# Patient Record
Sex: Male | Born: 1965 | Race: Black or African American | Hispanic: No | Marital: Married | Smoking: Never smoker
Health system: Southern US, Community
[De-identification: ages and names within clinical notes are randomized; demographics above are authoritative.]

## PROBLEM LIST (undated history)

## (undated) DIAGNOSIS — K219 Gastro-esophageal reflux disease without esophagitis: Secondary | ICD-10-CM

## (undated) DIAGNOSIS — I10 Essential (primary) hypertension: Secondary | ICD-10-CM

## (undated) HISTORY — DX: Gastro-esophageal reflux disease without esophagitis: K21.9

## (undated) HISTORY — DX: Essential (primary) hypertension: I10

---

## 2017-09-12 ENCOUNTER — Other Ambulatory Visit: Payer: Self-pay | Admitting: Family Medicine

## 2017-09-12 DIAGNOSIS — I10 Essential (primary) hypertension: Secondary | ICD-10-CM

## 2017-09-12 DIAGNOSIS — N289 Disorder of kidney and ureter, unspecified: Secondary | ICD-10-CM

## 2017-09-14 ENCOUNTER — Other Ambulatory Visit: Payer: Self-pay

## 2017-09-26 ENCOUNTER — Ambulatory Visit
Admission: RE | Admit: 2017-09-26 | Discharge: 2017-09-26 | Disposition: A | Discharge status: Home / Self Care | Payer: BLUE CROSS/BLUE SHIELD | Source: Ambulatory Visit | Attending: Family Medicine | Admitting: Family Medicine

## 2017-09-26 DIAGNOSIS — N289 Disorder of kidney and ureter, unspecified: Secondary | ICD-10-CM

## 2017-09-26 DIAGNOSIS — I10 Essential (primary) hypertension: Secondary | ICD-10-CM

## 2017-12-11 ENCOUNTER — Ambulatory Visit: Payer: Self-pay | Admitting: Urology

## 2018-01-09 ENCOUNTER — Ambulatory Visit: Payer: BLUE CROSS/BLUE SHIELD | Admitting: Urology

## 2018-02-27 ENCOUNTER — Other Ambulatory Visit: Payer: Self-pay

## 2018-02-27 ENCOUNTER — Encounter: Payer: Self-pay | Admitting: Urology

## 2018-02-27 ENCOUNTER — Ambulatory Visit (INDEPENDENT_AMBULATORY_CARE_PROVIDER_SITE_OTHER): Payer: BLUE CROSS/BLUE SHIELD | Admitting: Urology

## 2018-02-27 VITALS — BP 161/103 | HR 83 | Ht 71.0 in | Wt 199.0 lb

## 2018-02-27 DIAGNOSIS — R351 Nocturia: Secondary | ICD-10-CM

## 2018-02-27 DIAGNOSIS — N401 Enlarged prostate with lower urinary tract symptoms: Secondary | ICD-10-CM | POA: Diagnosis not present

## 2018-02-27 DIAGNOSIS — N400 Enlarged prostate without lower urinary tract symptoms: Secondary | ICD-10-CM | POA: Diagnosis not present

## 2018-02-27 LAB — URINALYSIS, COMPLETE
Bilirubin, UA: NEGATIVE
Glucose, UA: NEGATIVE
Ketones, UA: NEGATIVE
Leukocytes, UA: NEGATIVE
Nitrite, UA: NEGATIVE
Protein, UA: NEGATIVE
RBC, UA: NEGATIVE
Specific Gravity, UA: 1.01 (ref 1.005–1.030)
Urobilinogen, Ur: 0.2 mg/dL (ref 0.2–1.0)
pH, UA: 7 (ref 5.0–7.5)

## 2018-02-27 LAB — BLADDER SCAN AMB NON-IMAGING

## 2018-02-27 MED ORDER — FINASTERIDE 5 MG PO TABS: 5.0000 mg | ORAL_TABLET | Freq: Every day | ORAL | 3 refills | Status: DC

## 2018-02-27 NOTE — Progress Notes (Signed)
02/27/2018 3:07 PM   Leonard Richards Jane 04/20/1965 [id number]  Referring provider: Jarrett SohoWharton, Courtney, PA-C [address] [place], [place] [address]  Chief Complaint  Patient presents with  . Establish Care    HPI: 52 year old male who presents today for further evaluation of prostamegaly along with other gu related issues.  He reports that he underwent renal ultrasound ordered by his primary care physician for elevated creatinine 1.4.  He had normal kidneys bilaterally but there was incidental prostamegaly noted with some mass-effect into the bladder.  Mild baseline urinary symptoms including nocturia x3 when he drinks a protein shake just before bedtime.  When he voids this, he does not have many issues.  He also had some difficulty at times starting his urinary stream and weak stream.  IPSS as below.  No UTIs, gross hematuria or dysuria.  PSA 1.58 in 10/2017.  No recent rectal exam.  No family history of prostate cancer.  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  He is been interested in possible testosterone type supplementation to help with this.  He had a discussion with his primary care about this and she recommended discussing this further with the urologist.  Denies issues.  Libido intact.   IPSS    Row Name 02/27/18 1400         International Prostate Symptom Score   How often have you had the sensation of not emptying your bladder?  Less than half the time     How often have you had to urinate less than every two hours?  Not at All     How often have you found you stopped and started again several times when you urinated?  Not at All     How often have you found it difficult to postpone urination?  Less than 1 in 5 times     How often have you had a weak urinary stream?  Not at All     How often have you had to strain to start urination?  Not at All     How many times did you typically get up at night to urinate?  3 Times     Total IPSS Score   6       Quality of Life due to urinary symptoms   If you were to spend the rest of your life with your urinary condition just the way it is now how would you feel about that?  Pleased        Score:  1-7 Mild 8-19 Moderate 20-35 Severe  PMH: Past Medical History:  Diagnosis Date  . GERD (gastroesophageal reflux disease)   . Hypertension     Surgical History: History reviewed. No pertinent surgical history.  Home Medications:  Allergies as of 02/27/2018   No Known Allergies     Medication List        Accurate as of 02/27/18  3:07 PM. Always use your most recent med list.          amLODipine 10 MG tablet Commonly known as:  NORVASC daily.   finasteride 5 MG tablet Commonly known as:  PROSCAR Take 1 tablet (5 mg total) by mouth daily.   lisinopril 40 MG tablet Commonly known as:  PRINIVIL,ZESTRIL daily.   metFORMIN 500 MG 24 hr tablet Commonly known as:  GLUCOPHAGE-XR daily.   TOPROL XL 25 MG 24 hr tablet Generic drug:  metoprolol succinate daily.       Allergies: No Known Allergies  Family History: Family History  Problem Relation Age of Onset  . Heart defect Mother   . Thyroid disease Mother   . Asthma Mother   . Hypertension Mother   . Alzheimer's disease Maternal Grandmother   . Heart disease Maternal Grandfather     Social History:  reports that he has never smoked. He has never used smokeless tobacco. He reports that he drinks alcohol. He reports that he does not use drugs.  ROS: UROLOGY Frequent Urination?: No Hard to postpone urination?: No Burning/pain with urination?: No Get up at night to urinate?: No Leakage of urine?: No Urine stream starts and stops?: Yes Trouble starting stream?: Yes Do you have to strain to urinate?: No Blood in urine?: No Urinary tract infection?: No Sexually transmitted disease?: No Injury to kidneys or bladder?: No Painful intercourse?: No Weak stream?: No Erection problems?: No Penile pain?:  No  Gastrointestinal Nausea?: No Vomiting?: No Indigestion/heartburn?: No Diarrhea?: No Constipation?: No  Constitutional Fever: No Night sweats?: No Weight loss?: No Fatigue?: No  Skin Skin rash/lesions?: No Itching?: No  Eyes Blurred vision?: No Double vision?: No  Ears/Nose/Throat Sore throat?: No Sinus problems?: No  Hematologic/Lymphatic Swollen glands?: No Easy bruising?: No  Cardiovascular Leg swelling?: No Chest pain?: No  Respiratory Cough?: No Shortness of breath?: No  Endocrine Excessive thirst?: No  Musculoskeletal Back pain?: No Joint pain?: No  Neurological Headaches?: No Dizziness?: No  Psychologic Depression?: No Anxiety?: No  Physical Exam: BP (!) 161/103   Pulse 83   Ht 5\' 11"  (1.803 m)   Wt 199 lb (90.3 kg)   BMI 27.75 kg/m   Constitutional:  Alert and oriented, No acute distress. HEENT: [place] AT, moist mucus membranes.  Trachea midline, no masses. Cardiovascular: No clubbing, cyanosis, or edema. Respiratory: Normal respiratory effort, no increased work of breathing. Rectal: Normal sphincter tone.  45 cc prostate, nontender, no nodules, rubbery lateral lobes. Skin: No rashes, bruises or suspicious lesions.  Male pattern baldness appreciated. Neurologic: Grossly intact, no focal deficits, moving all 4 extremities. Psychiatric: Normal mood and affect.  Laboratory Data: Cr 1.4 PSA as above  Urinalysis Urinalysis reviewed today, completely negative.  Pertinent Imaging: Results for orders placed during the hospital encounter of 09/26/17  US RENAL   Narrative CLINICAL DATA:  52 year old hypertensive male with decreased renal function. Initial encounter.  EXAM: RENAL / URINARY TRACT ULTRASOUND COMPLETE  COMPARISON:  None.  FINDINGS: Right Kidney:  Length: 9.8 cm. Echogenicity within normal limits. No mass or hydronephrosis visualized.  Left Kidney:  Length: 9.4 cm. Echogenicity within normal limits. No mass  or hydronephrosis visualized.  Bladder:  Appears normal for degree of bladder distention.  Slightly lobulated prostate gland measuring up to 4.4 cm causes mild impression upon the bladder base. Clinical and laboratory correlation recommended.  IMPRESSION: Kidneys unremarkable.  Slightly lobulated prostate gland measuring up to 4.4 cm causes mild impression upon the bladder base. Clinical and laboratory correlation recommended.   Electronically Signed   By: Lacy Duverney M.D.   On: 09/26/2017 18:32    PVR 38 cc  Renal ultrasound was personally reviewed today.  Assessment & Plan:    1. Enlarged prostate Incidental prostamegaly with mild associated urinary symptoms Given his concern with male pattern baldness, finasteride may be a good medication to treat both his urinary symptoms as well as hair loss which is a side effect of the medication Reviewed the risks and benefits of this medication in detail including the black box warning Plan for  PSA and recheck of urinary symptoms in 1 year, rectal exam every other year until 68 then annually - Urinalysis, Complete - BLADDER SCAN AMB NON-IMAGING - PSA; Future  2. BPH associated with nocturia Discussed behavioral modification for nighttime urinary symptoms   Return in about 1 year (around 02/28/2019) for PSA/ DRE/ IPSS.  Vanna Scotland, MD  [facility] [address], [address] [place], [place] [address] [phone]

## 2019-01-15 ENCOUNTER — Other Ambulatory Visit: Payer: Self-pay | Admitting: Urology

## 2019-03-05 ENCOUNTER — Ambulatory Visit: Payer: BLUE CROSS/BLUE SHIELD | Admitting: Urology

## 2019-04-15 ENCOUNTER — Other Ambulatory Visit: Payer: Self-pay | Admitting: Urology

## 2019-05-07 ENCOUNTER — Ambulatory Visit: Payer: BC Managed Care – PPO | Admitting: Urology

## 2019-05-27 ENCOUNTER — Other Ambulatory Visit: Payer: Self-pay | Admitting: *Deleted

## 2019-05-27 DIAGNOSIS — N401 Enlarged prostate with lower urinary tract symptoms: Secondary | ICD-10-CM

## 2019-05-27 DIAGNOSIS — R351 Nocturia: Secondary | ICD-10-CM

## 2019-05-28 ENCOUNTER — Other Ambulatory Visit: Payer: BC Managed Care – PPO

## 2019-05-28 ENCOUNTER — Other Ambulatory Visit: Payer: Self-pay

## 2019-05-28 DIAGNOSIS — N401 Enlarged prostate with lower urinary tract symptoms: Secondary | ICD-10-CM

## 2019-05-29 LAB — PSA: Prostate Specific Ag, Serum: 0.8 ng/mL (ref 0.0–4.0)

## 2019-06-03 NOTE — Progress Notes (Signed)
06/04/2019 7:02 PM   Leonard Richards [birth year] [id number]  Referring provider: Marda Stalker, [place],  [place] [address]  Chief Complaint  Patient presents with  . Benign Prostatic Hypertrophy  . Nocturia    HPI: Leonard Richards is a 54 yo African American M who returns today for the evaluation and management of BPH with LUTS.   He was last seen 1 year ago for follow up.  He is currently on finasteride. Pt reports of no urinary symptoms and felt finasteride has helped him immensely. He reports of better stream and nocturia from x3 to x1.   His most recent PSA from 05/28/19 was 0.8.   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  No FMHx of prostate cancer.   IPSS    Row Name 06/04/19 1100         International Prostate Symptom Score   How often have you had the sensation of not emptying your bladder?  Not at All     How often have you had to urinate less than every two hours?  Not at All     How often have you found you stopped and started again several times when you urinated?  Not at All     How often have you found it difficult to postpone urination?  Not at All     How often have you had a weak urinary stream?  Not at All     How often have you had to strain to start urination?  Not at All     How many times did you typically get up at night to urinate?  1 Time     Total IPSS Score  1       Quality of Life due to urinary symptoms   If you were to spend the rest of your life with your urinary condition just the way it is now how would you feel about that?  Pleased         Score:  1-7 Mild 8-19 Moderate 20-35 Severe  PMH: Past Medical History:  Diagnosis Date  . GERD (gastroesophageal reflux disease)   . Hypertension     Surgical History: No past surgical history on file.  Home Medications:  Allergies as of 06/04/2019   No Known Allergies     Medication List       Accurate as of June 04, 2019  7:02  PM. If you have any questions, ask your nurse or doctor.        amLODipine 10 MG tablet Commonly known as: NORVASC daily.   finasteride 5 MG tablet Commonly known as: PROSCAR TAKE 1 TABLET DAILY   lisinopril 40 MG tablet Commonly known as: ZESTRIL daily.   metFORMIN 500 MG 24 hr tablet Commonly known as: GLUCOPHAGE-XR daily.   Toprol XL 25 MG 24 hr tablet Generic drug: metoprolol succinate daily.       Allergies: No Known Allergies  Family History: Family History  Problem Relation Age of Onset  . Heart defect Mother   . Thyroid disease Mother   . Asthma Mother   . Hypertension Mother   . Alzheimer's disease Maternal Grandmother   . Heart disease Maternal Grandfather     Social History:  reports that he has never smoked. He has never used smokeless tobacco. He reports current alcohol use. He reports that he does not use drugs.   Physical Exam: BP 133/83   Pulse 91   Ht  5\' 11"  (1.803 m)   Wt 189 lb (85.7 kg)   BMI 26.36 kg/m   Constitutional:  Alert and oriented, No acute distress. HEENT: [place] AT, moist mucus membranes.  Trachea midline, no masses. Cardiovascular: No clubbing, cyanosis, or edema. Respiratory: Normal respiratory effort, no increased work of breathing. Rectal: Prostate 40g, non tender, no nodules and normal sphincter tone  Skin: No rashes, bruises or suspicious lesions. Neurologic: Grossly intact, no focal deficits moving all 4 extremities. Psychiatric: Normal mood and affect.  Assessment & Plan:    1. 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 PSA screening  Up to date; no concerns  Return in about 1 year (around 06/03/2020) for IPSS, PSA/ DRE.  [facility] [address], [address] [place], [place] [phone]  I, [phone], am acting as a scribe for Dr. Donne Hazel,  I have seen and examined the patient, labs/ imaging reviewed and discussed  management with Vanna Scotland.  I reviewed the PA's note and agree with the documented findings and plan of care.

## 2019-06-04 ENCOUNTER — Other Ambulatory Visit: Payer: Self-pay

## 2019-06-04 ENCOUNTER — Ambulatory Visit (INDEPENDENT_AMBULATORY_CARE_PROVIDER_SITE_OTHER): Payer: BC Managed Care – PPO | Admitting: Urology

## 2019-06-04 VITALS — BP 133/83 | HR 91 | Ht 71.0 in | Wt 189.0 lb

## 2019-06-04 DIAGNOSIS — Z125 Encounter for screening for malignant neoplasm of prostate: Secondary | ICD-10-CM | POA: Diagnosis not present

## 2019-06-04 DIAGNOSIS — R351 Nocturia: Secondary | ICD-10-CM | POA: Diagnosis not present

## 2019-06-04 DIAGNOSIS — N401 Enlarged prostate with lower urinary tract symptoms: Secondary | ICD-10-CM

## 2019-06-05 MED ORDER — FINASTERIDE 5 MG PO TABS: 5.0000 mg | ORAL_TABLET | Freq: Every day | ORAL | 0 refills | Status: AC

## 2019-06-05 NOTE — Addendum Note (Signed)
Addended by: Milas Kocher A on: 06/05/2019 11:54 AM   Modules accepted: Orders

## 2020-04-08 ENCOUNTER — Other Ambulatory Visit: Payer: Self-pay

## 2020-04-10 ENCOUNTER — Other Ambulatory Visit: Payer: Self-pay

## 2020-04-10 ENCOUNTER — Other Ambulatory Visit: Payer: BC Managed Care – PPO

## 2020-04-10 DIAGNOSIS — R351 Nocturia: Secondary | ICD-10-CM

## 2020-04-10 DIAGNOSIS — Z125 Encounter for screening for malignant neoplasm of prostate: Secondary | ICD-10-CM

## 2020-04-10 DIAGNOSIS — N401 Enlarged prostate with lower urinary tract symptoms: Secondary | ICD-10-CM

## 2020-04-11 LAB — PSA: Prostate Specific Ag, Serum: 1.2 ng/mL (ref 0.0–4.0)

## 2020-05-20 ENCOUNTER — Ambulatory Visit: Payer: Self-pay | Admitting: Urology

## 2020-05-29 ENCOUNTER — Other Ambulatory Visit: Payer: Self-pay

## 2020-06-02 ENCOUNTER — Ambulatory Visit: Payer: Self-pay | Admitting: Physician Assistant

## 2020-06-09 ENCOUNTER — Ambulatory Visit: Payer: Self-pay | Admitting: Urology

## 2020-07-14 ENCOUNTER — Ambulatory Visit: Payer: BC Managed Care – PPO | Admitting: Urology

## 2020-07-21 NOTE — Progress Notes (Signed)
Patient left clinic abruptly prior to being seen.  He will reschedule.  Vanna Scotland, MD

## 2020-07-22 ENCOUNTER — Emergency Department
Admission: EM | Admit: 2020-07-22 | Discharge: 2020-07-22 | Disposition: A | Discharge status: Home / Self Care | Payer: BC Managed Care – PPO | Attending: Emergency Medicine | Admitting: Emergency Medicine

## 2020-07-22 ENCOUNTER — Other Ambulatory Visit: Payer: Self-pay

## 2020-07-22 ENCOUNTER — Ambulatory Visit (INDEPENDENT_AMBULATORY_CARE_PROVIDER_SITE_OTHER): Payer: BC Managed Care – PPO | Admitting: Urology

## 2020-07-22 ENCOUNTER — Emergency Department: Payer: BC Managed Care – PPO

## 2020-07-22 ENCOUNTER — Encounter: Payer: Self-pay | Admitting: Emergency Medicine

## 2020-07-22 DIAGNOSIS — Z79899 Other long term (current) drug therapy: Secondary | ICD-10-CM | POA: Diagnosis not present

## 2020-07-22 DIAGNOSIS — R55 Syncope and collapse: Secondary | ICD-10-CM | POA: Diagnosis not present

## 2020-07-22 DIAGNOSIS — E119 Type 2 diabetes mellitus without complications: Secondary | ICD-10-CM | POA: Diagnosis not present

## 2020-07-22 DIAGNOSIS — I10 Essential (primary) hypertension: Secondary | ICD-10-CM | POA: Diagnosis not present

## 2020-07-22 DIAGNOSIS — R1032 Left lower quadrant pain: Secondary | ICD-10-CM | POA: Diagnosis present

## 2020-07-22 DIAGNOSIS — K4090 Unilateral inguinal hernia, without obstruction or gangrene, not specified as recurrent: Secondary | ICD-10-CM | POA: Diagnosis not present

## 2020-07-22 DIAGNOSIS — Z7984 Long term (current) use of oral hypoglycemic drugs: Secondary | ICD-10-CM | POA: Insufficient documentation

## 2020-07-22 DIAGNOSIS — N401 Enlarged prostate with lower urinary tract symptoms: Secondary | ICD-10-CM

## 2020-07-22 DIAGNOSIS — R351 Nocturia: Secondary | ICD-10-CM

## 2020-07-22 LAB — COMPREHENSIVE METABOLIC PANEL
ALT: 37 U/L (ref 0–44)
AST: 38 U/L (ref 15–41)
Albumin: 4 g/dL (ref 3.5–5.0)
Alkaline Phosphatase: 59 U/L (ref 38–126)
Anion gap: 9 (ref 5–15)
BUN: 20 mg/dL (ref 6–20)
CO2: 24 mmol/L (ref 22–32)
Calcium: 9 mg/dL (ref 8.9–10.3)
Chloride: 105 mmol/L (ref 98–111)
Creatinine, Ser: 1.28 mg/dL — ABNORMAL HIGH (ref 0.61–1.24)
GFR, Estimated: >60 mL/min (ref >60)
Glucose, Bld: 111 mg/dL — ABNORMAL HIGH (ref 70–99)
Potassium: 3.8 mmol/L (ref 3.5–5.1)
Sodium: 138 mmol/L (ref 135–145)
Total Bilirubin: 0.7 mg/dL (ref 0.3–1.2)
Total Protein: 7.6 g/dL (ref 6.5–8.1)

## 2020-07-22 LAB — CBC
HCT: 44.5 % (ref 39.0–52.0)
Hemoglobin: 14.4 g/dL (ref 13.0–17.0)
MCH: 26.6 pg (ref 26.0–34.0)
MCHC: 32.4 g/dL (ref 30.0–36.0)
MCV: 82.1 fL (ref 80.0–100.0)
Platelets: 265 10*3/uL (ref 150–400)
RBC: 5.42 MIL/uL (ref 4.22–5.81)
RDW: 13.9 % (ref 11.5–15.5)
WBC: 4.3 10*3/uL (ref 4.0–10.5)
nRBC: 0 % (ref 0.0–0.2)

## 2020-07-22 LAB — URINALYSIS, COMPLETE (UACMP) WITH MICROSCOPIC
Bacteria, UA: NONE SEEN
Bilirubin Urine: NEGATIVE
Glucose, UA: NEGATIVE mg/dL
Hgb urine dipstick: NEGATIVE
Ketones, ur: NEGATIVE mg/dL
Leukocytes,Ua: NEGATIVE
Nitrite: NEGATIVE
Protein, ur: NEGATIVE mg/dL
Specific Gravity, Urine: 1.021 (ref 1.005–1.030)
Squamous Epithelial / HPF: NONE SEEN (ref 0–5)
pH: 7 (ref 5.0–8.0)

## 2020-07-22 LAB — TROPONIN I (HIGH SENSITIVITY): Troponin I (High Sensitivity): 6 ng/L (ref <18)

## 2020-07-22 LAB — LIPASE, BLOOD: Lipase: 123 U/L — ABNORMAL HIGH (ref 11–51)

## 2020-07-22 MED ORDER — LACTATED RINGERS IV BOLUS
1000.0000 mL | Freq: Once | INTRAVENOUS | Status: AC
  Administered 2020-07-22: 1000 mL via INTRAVENOUS

## 2020-07-22 MED ORDER — IOHEXOL 300 MG/ML  SOLN
100.0000 mL | Freq: Once | INTRAMUSCULAR | Status: AC | PRN
  Administered 2020-07-22: 100 mL via INTRAVENOUS

## 2020-07-22 MED ORDER — ONDANSETRON HCL 4 MG/2ML IJ SOLN
4.0000 mg | Freq: Once | INTRAMUSCULAR | Status: AC
  Administered 2020-07-22: 4 mg via INTRAVENOUS
  Filled 2020-07-22: qty 2

## 2020-07-22 MED ORDER — ONDANSETRON 4 MG PO TBDP: 4.0000 mg | ORAL_TABLET | Freq: Three times a day (TID) | ORAL | 0 refills | Status: AC | PRN

## 2020-07-22 NOTE — ED Provider Notes (Signed)
[facility] Emergency Department Provider Note   ____________________________________________   Event Date/Time   First MD Initiated Contact with Patient 07/22/20 1501     (approximate)  I have reviewed the triage vital signs and the nursing notes.   HISTORY  Chief Complaint Abdominal Pain    HPI Leonard Richards is a 55 y.o. male with past medical history of hypertension, diabetes, and GERD who presents to the ED complaining of abdominal pain.  Patient reports that he has been dealing with intermittent episodes of pain in the left lower quadrant of his abdomen for the past couple of weeks.  This is been associated with frequent loose bowel movements, but he denies any blood in his stool and has not had any nausea or vomiting.  He was going to his PCPs office to be seen for the symptoms today, when he had sudden onset of similar pain in his left lower quadrant but more severe than in the past.  When this pain came on, he started to feel very lightheaded like he was going to pass out.  He denies any associated chest pain or shortness of breath.  Pain is now improving and he has not taken anything for pain today.  He denies any fevers, dysuria, flank pain.        Past Medical History:  Diagnosis Date  . GERD (gastroesophageal reflux disease)   . Hypertension     There are no problems to display for this patient.   History reviewed. No pertinent surgical history.  Prior to Admission medications   Medication Sig Start Date End Date Taking? Authorizing Provider  amLODipine (NORVASC) 10 MG tablet daily. 12/15/17   [provider]  finasteride (PROSCAR) 5 MG tablet Take 1 tablet (5 mg total) by mouth daily. 06/05/19   Vanna Scotland, MD  lisinopril (PRINIVIL,ZESTRIL) 40 MG tablet daily. 02/17/18   [provider]  metFORMIN (GLUCOPHAGE-XR) 500 MG 24 hr tablet daily. 02/12/18   [provider]  TOPROL XL 25 MG 24 hr tablet daily.  02/09/18   [provider]  valsartan (DIOVAN) 320 MG tablet Take 320 mg by mouth daily. 06/19/20   [provider]    Allergies Patient has no known allergies.  Family History  Problem Relation Age of Onset  . Heart defect Mother   . Thyroid disease Mother   . Asthma Mother   . Hypertension Mother   . Alzheimer's disease Maternal Grandmother   . Heart disease Maternal Grandfather     Social History Social History   Tobacco Use  . Smoking status: Never Smoker  . Smokeless tobacco: Never Used  Vaping Use  . Vaping Use: Never used  Substance Use Topics  . Alcohol use: Yes    Comment: occasionally  . Drug use: Never    Review of Systems  Constitutional: No fever/chills Eyes: No visual changes. ENT: No sore throat. Cardiovascular: Denies chest pain.  Positive for lightheadedness and near syncope. Respiratory: Denies shortness of breath. Gastrointestinal: Positive for abdominal pain.  No nausea, no vomiting.  No diarrhea.  No constipation. Genitourinary: Negative for dysuria. Musculoskeletal: Negative for back pain. Skin: Negative for rash. Neurological: Negative for headaches, focal weakness or numbness.  ____________________________________________   PHYSICAL EXAM:  VITAL SIGNS: ED Triage Vitals  Enc Vitals Group     BP      Pulse      Resp      Temp      Temp src  SpO2      Weight      Height      Head Circumference      Peak Flow      Pain Score      Pain Loc      Pain Edu?      Excl. in GC?     Constitutional: Alert and oriented. Eyes: Conjunctivae are normal. Head: Atraumatic. Nose: No congestion/rhinnorhea. Mouth/Throat: Mucous membranes are moist. Neck: Normal ROM Cardiovascular: Normal rate, regular rhythm. Grossly normal heart sounds. Respiratory: Normal respiratory effort.  No retractions. Lungs CTAB. Gastrointestinal: Soft and mildly tender to palpation in the left lower quadrant with no rebound or guarding. No  distention. Genitourinary: deferred Musculoskeletal: No lower extremity tenderness nor edema. Neurologic:  Normal speech and language. No gross focal neurologic deficits are appreciated. Skin:  Skin is warm, dry and intact. No rash noted. Psychiatric: Mood and affect are normal. Speech and behavior are normal.  ____________________________________________   LABS (all labs ordered are listed, but only abnormal results are displayed)  Labs Reviewed  LIPASE, BLOOD - Abnormal; Notable for the following components:      Result Value   Lipase 123 (*)    All other components within normal limits  COMPREHENSIVE METABOLIC PANEL - Abnormal; Notable for the following components:   Glucose, Bld 111 (*)    Creatinine, Ser 1.28 (*)    All other components within normal limits  URINALYSIS, COMPLETE (UACMP) WITH MICROSCOPIC - Abnormal; Notable for the following components:   Color, Urine YELLOW (*)    APPearance CLEAR (*)    All other components within normal limits  CBC  TROPONIN I (HIGH SENSITIVITY)   ____________________________________________  EKG  ED ECG REPORT I, Chesley Noon, the attending physician, personally viewed and interpreted this ECG.   Date: 07/22/2020  EKG Time: 15:05  Rate: 76  Rhythm: normal sinus rhythm  Axis: Normal  Intervals:none  ST&T Change: None  PROCEDURES  Procedure(s) performed (including Critical Care):  Procedures   ____________________________________________   INITIAL IMPRESSION / ASSESSMENT AND PLAN / ED COURSE       55 year old male with past medical history of hypertension, diabetes, and GERD presents to the ED with intermittent left lower quadrant pain for the past couple of weeks, more severe today and associated with near syncope.  EKG shows no evidence of arrhythmia or ischemia and I doubt cardiac etiology of his lightheadedness and near syncope.  He denies any chest pain or shortness of breath, episode sounds more likely to be  vasovagal.  We will screen abdominal labs and CT scan given his tenderness, differential includes diverticulitis, inguinal hernia, IBS, IBD, gastroenteritis.  Labs are unremarkable, UA shows no signs of infection.  CT scan does show left inguinal hernia containing nonobstructed sigmoid colon.  On reexamination hernia is easily reducible and patient has minimal pain.  He is appropriate for discharge home with general surgery follow-up, was counseled to return to the ED for new or worsening symptoms.  Patient agrees with plan.      ____________________________________________   FINAL CLINICAL IMPRESSION(S) / ED DIAGNOSES  Final diagnoses:  Non-recurrent unilateral inguinal hernia without obstruction or gangrene     ED Discharge Orders    None       Note:  This document was prepared using Dragon voice recognition software and may include unintentional dictation errors.   Chesley Noon, MD 07/22/20 [phone]

## 2020-07-22 NOTE — ED Notes (Signed)
Patient transported to CT 

## 2020-07-22 NOTE — Progress Notes (Incomplete)
07/22/2020  2:35 PM    Leonard Richards 01/03/1966 [id number]  Referring provider: Jarrett Soho, PA-C [address] [place],  [place] [address] Chief Complaint  Patient presents with  . Benign Prostatic Hypertrophy    HPI: Leonard Richards is a 55 y.o. male with a history of BPH with associated nocturia and PSA screening, who presents today for a one year follow up for the evaluation and management of IPSS, DRE, and PSA.  Last seen on 06/04/2019. He was on Finasteride, and reported no urinary symptoms since using this. He reported better stream and nocturia decreased from x3 to x1.  No family history of prostate cancer.  Never smoker.  PSA trend: 0.8 - 05/28/2019 1.2 - 04/10/2020  Today ***    Score:  1-7 Mild 8-19 Moderate 20-35 Severe   PMH: Past Medical History:  Diagnosis Date  . GERD (gastroesophageal reflux disease)   . Hypertension     Surgical History: No past surgical history on file.  Home Medications:  Allergies as of 07/22/2020   No Known Allergies     Medication List       Accurate as of July 22, 2020  2:35 PM. If you have any questions, ask your nurse or doctor.        amLODipine 10 MG tablet Commonly known as: NORVASC daily.   finasteride 5 MG tablet Commonly known as: PROSCAR Take 1 tablet (5 mg total) by mouth daily.   lisinopril 40 MG tablet Commonly known as: ZESTRIL daily.   metFORMIN 500 MG 24 hr tablet Commonly known as: GLUCOPHAGE-XR daily.   Toprol XL 25 MG 24 hr tablet Generic drug: metoprolol succinate daily.   valsartan 320 MG tablet Commonly known as: DIOVAN Take 320 mg by mouth daily.       Allergies: No Known Allergies  Family History: Family History  Problem Relation Age of Onset  . Heart defect Mother   . Thyroid disease Mother   . Asthma Mother   . Hypertension Mother   . Alzheimer's disease Maternal Grandmother   . Heart disease Maternal Grandfather     Social History:   reports that he  has never smoked. He has never used smokeless tobacco. He reports current alcohol use. He reports that he does not use drugs.  ROS: Pertinent ROS in HPI.  Physical Exam: There were no vitals taken for this visit.  Constitutional:  Alert and oriented, No acute distress. HEENT: [place] AT, moist mucus membranes.  Trachea midline, no masses. Cardiovascular: No clubbing, cyanosis, or edema. Respiratory: Normal respiratory effort, no increased work of breathing. ***GI: Abdomen is soft, non tender, non distended, no abdominal masses. Liver and spleen not palpable.  No hernias appreciated.  Stool sample for occult testing is not indicated. ***GU: Phallus circumcised/uncircumcised without lesions, testes descended bilaterally without masses or tenderness, spermatic cord/epididymis palpably normal bilaterally.  Vasa palpable bilaterally ***Rectal: Prostate is *** grams, no nodules, non-tender, with normal sphincter tone. Skin: No rashes, bruises or suspicious lesions. Neurologic: Grossly intact, no focal deficits, moving all 4 extremities. Psychiatric: Normal mood and affect.  Laboratory Data:  No results found for: CREATININE    No results found for: PSA   No results found for: TESTOSTERONE   No results found for: HGBA1C   I have reviewed the labs.  Urinalysis    I have reviewed the labs.  Urine Culture:   I have reviewed the labs.  Pertinent Imaging:   No results found for this or any previous  visit.    I have personally reviewed the images and agree with radiologist interpretation.    Assessment & Plan:      Follow Up:  No follow-ups on file.   Jonette Pesa, am acting as a scribe for Dr. Vanna Scotland.    [facility] [address], [address] [place], [place] [address] [phone]

## 2020-07-22 NOTE — ED Triage Notes (Signed)
Pt comes into the ED via POV c/o LLQ abdominal pain that was sudden onset today where it caused him to become diaphoretic and have a near syncopal episode.  Pt denies any nausea, vomiting, or diarrhea.  Pt has even and unlabored respirations.  Pt states he has been having stomach issues for a little bit, but today was the first time he has had a sudden onset of pain and feeling as though he was going to pass out.

## 2020-07-22 NOTE — ED Notes (Signed)
RN to bedside to answer call light. Pt reports vomiting and diarrhea after drinking soda and eating chips, requesting meds.  Dr Larinda Buttery notified

## 2020-08-12 ENCOUNTER — Ambulatory Visit: Payer: BC Managed Care – PPO | Admitting: Urology

## 2020-08-19 ENCOUNTER — Ambulatory Visit: Payer: BC Managed Care – PPO | Admitting: Urology

## 2020-09-02 ENCOUNTER — Ambulatory Visit: Payer: BC Managed Care – PPO | Admitting: Urology

## 2020-09-16 ENCOUNTER — Ambulatory Visit: Payer: BC Managed Care – PPO | Admitting: Urology

## 2020-10-06 ENCOUNTER — Ambulatory Visit: Payer: BC Managed Care – PPO | Admitting: Urology

## 2020-10-21 ENCOUNTER — Ambulatory Visit: Payer: BC Managed Care – PPO | Admitting: Urology

## 2020-10-23 ENCOUNTER — Encounter: Payer: Self-pay | Admitting: Urology

## 2021-04-15 DIAGNOSIS — H524 Presbyopia: Secondary | ICD-10-CM | POA: Diagnosis not present

## 2021-04-15 DIAGNOSIS — E119 Type 2 diabetes mellitus without complications: Secondary | ICD-10-CM | POA: Diagnosis not present

## 2021-04-29 DIAGNOSIS — I10 Essential (primary) hypertension: Secondary | ICD-10-CM | POA: Diagnosis not present

## 2021-04-29 DIAGNOSIS — R350 Frequency of micturition: Secondary | ICD-10-CM | POA: Diagnosis not present

## 2021-04-29 DIAGNOSIS — N1831 Chronic kidney disease, stage 3a: Secondary | ICD-10-CM | POA: Diagnosis not present

## 2021-04-29 DIAGNOSIS — R739 Hyperglycemia, unspecified: Secondary | ICD-10-CM | POA: Diagnosis not present

## 2022-02-15 DIAGNOSIS — I10 Essential (primary) hypertension: Secondary | ICD-10-CM | POA: Diagnosis not present

## 2022-02-15 DIAGNOSIS — Z23 Encounter for immunization: Secondary | ICD-10-CM | POA: Diagnosis not present

## 2022-02-15 DIAGNOSIS — N1831 Chronic kidney disease, stage 3a: Secondary | ICD-10-CM | POA: Diagnosis not present

## 2022-02-15 DIAGNOSIS — R739 Hyperglycemia, unspecified: Secondary | ICD-10-CM | POA: Diagnosis not present

## 2022-02-15 DIAGNOSIS — Z125 Encounter for screening for malignant neoplasm of prostate: Secondary | ICD-10-CM | POA: Diagnosis not present

## 2022-07-29 IMAGING — CT CT ABD-PELV W/ CM
2 of 5 series · 16 of 46 positions shown, 18 images · IV contrast (omnipaque)
Comparison: None.

CLINICAL DATA: Left lower quadrant pain

EXAM:
CT ABDOMEN AND PELVIS WITH CONTRAST
TECHNIQUE: Multidetector CT imaging of the abdomen and pelvis was performed
using the standard protocol following bolus administration of
intravenous contrast.
CONTRAST:  100mL OMNIPAQUE IOHEXOL 300 MG/ML  SOLN

[Series 2: axial st · axial · 0.77mm/px · z∈[-506,-41]mm · 13 of 105 slices shown, 15 images]
[im 6/105  soft-tissue]
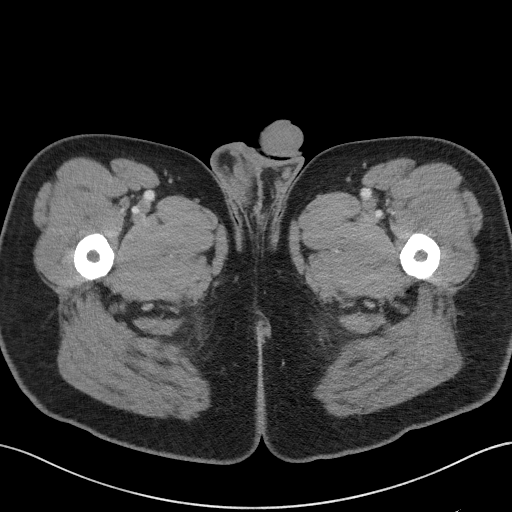
[im 6/105  bone]
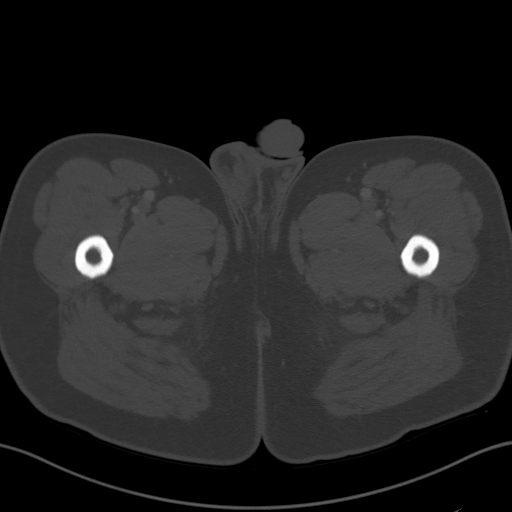
[im 17/105  soft-tissue]
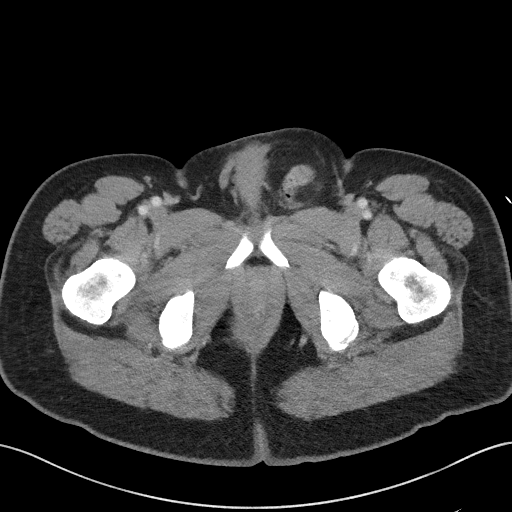
[im 22/105  soft-tissue]
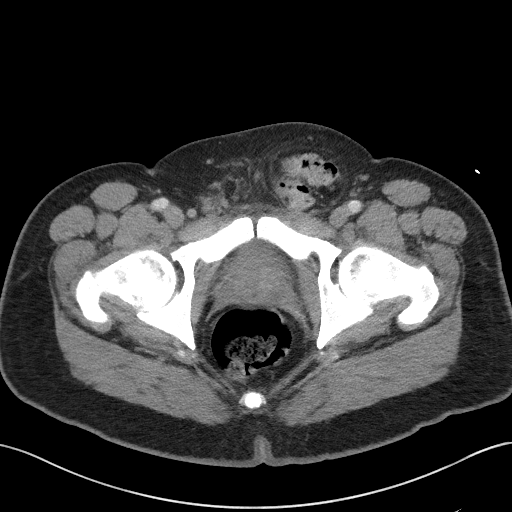
[im 28/105  soft-tissue]
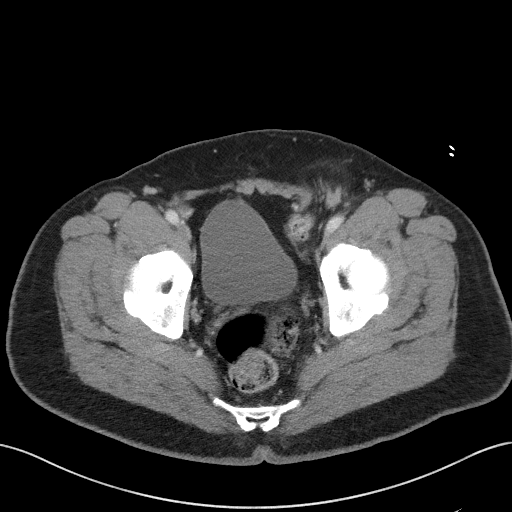
[im 39/105  soft-tissue]
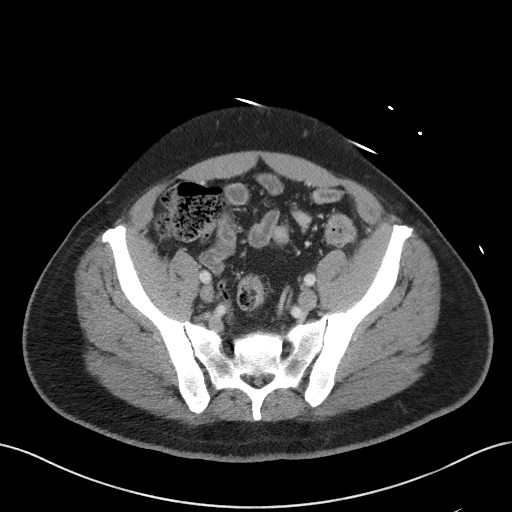
[im 44/105  soft-tissue]
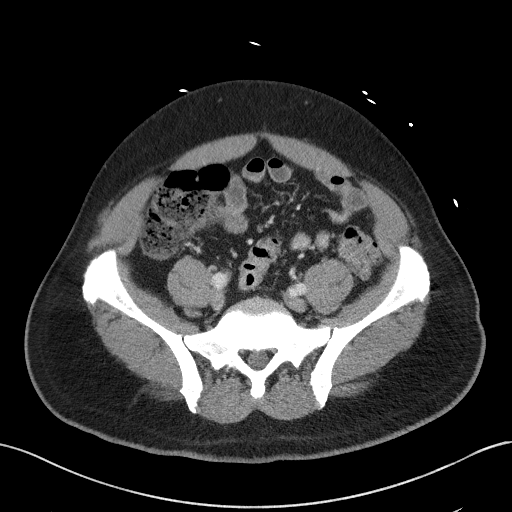
[im 55/105  soft-tissue]
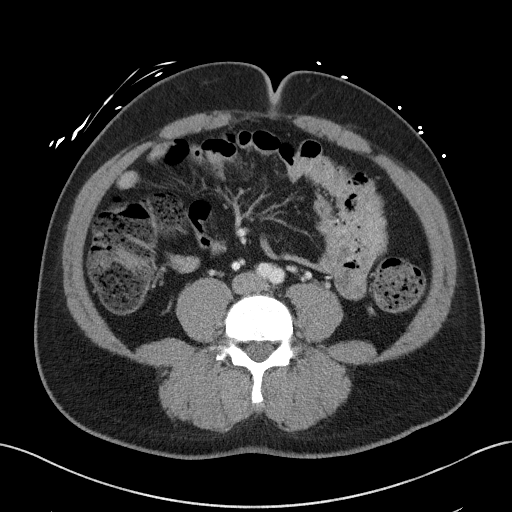
[im 61/105  soft-tissue]
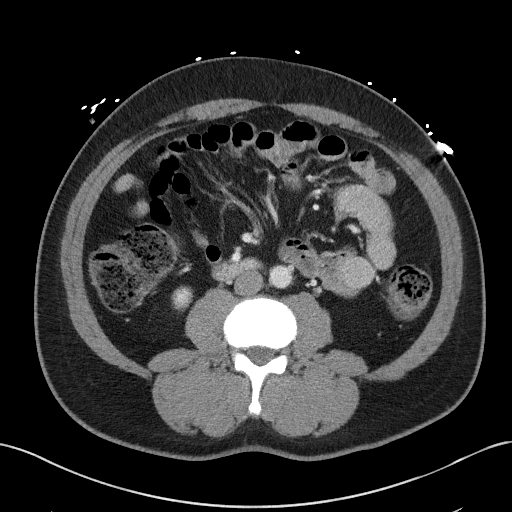
[im 66/105  soft-tissue]
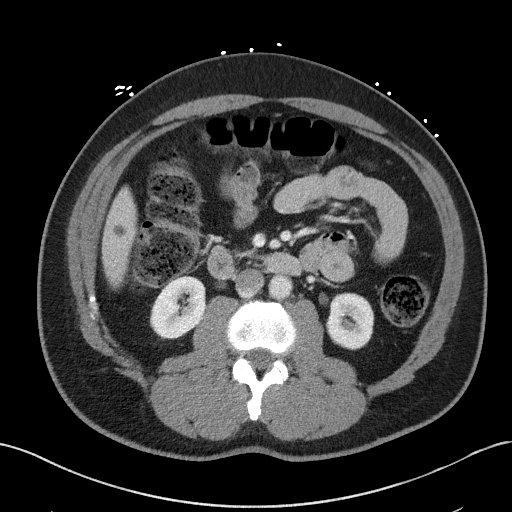
[im 66/105  bone]
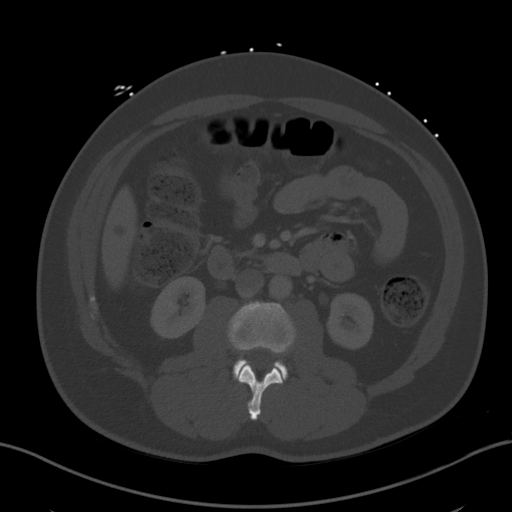
[im 77/105  soft-tissue]
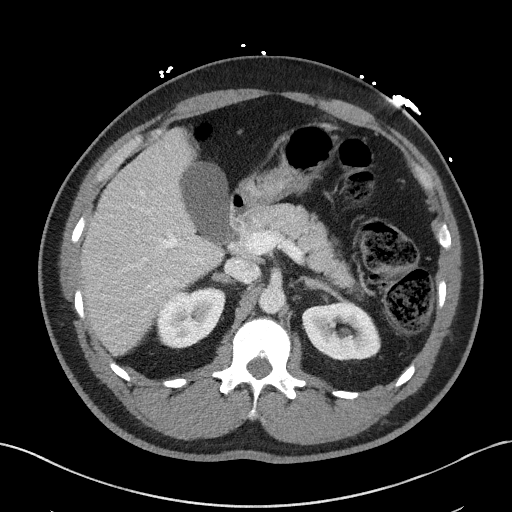
[im 83/105  soft-tissue]
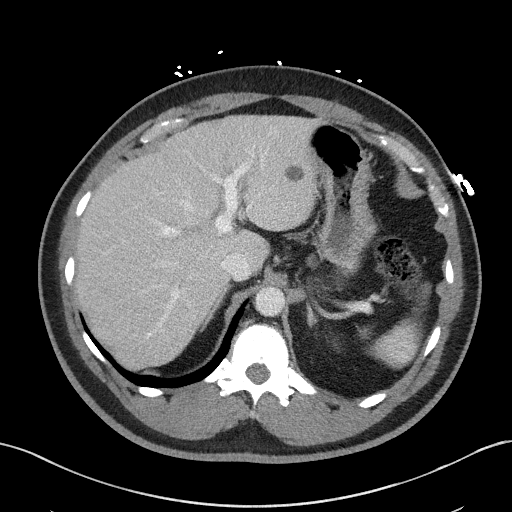
[im 88/105  soft-tissue]
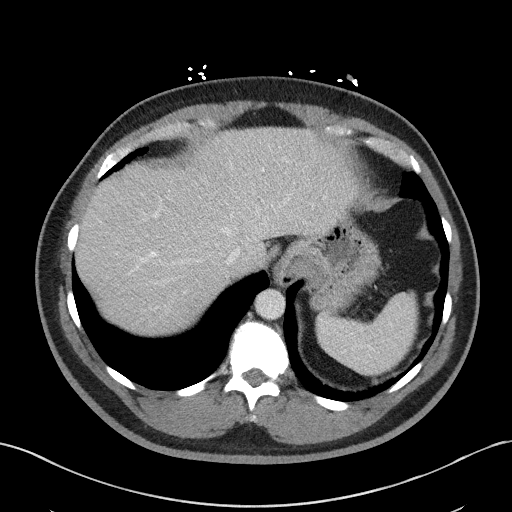
[im 99/105  soft-tissue]
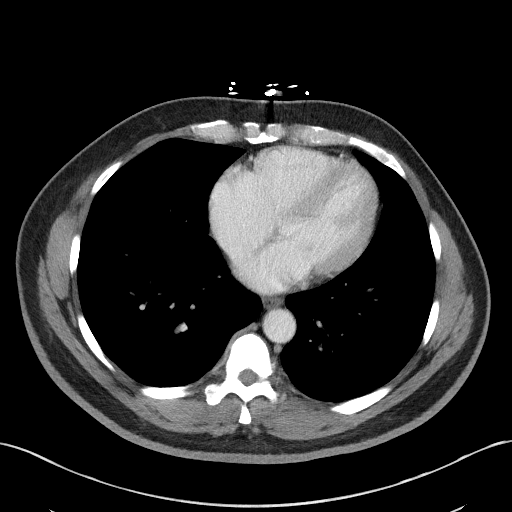

[Series 5: coronal st · coronal · 0.85mm/px · 3 of 101 slices shown]
[im 34/101  soft-tissue]
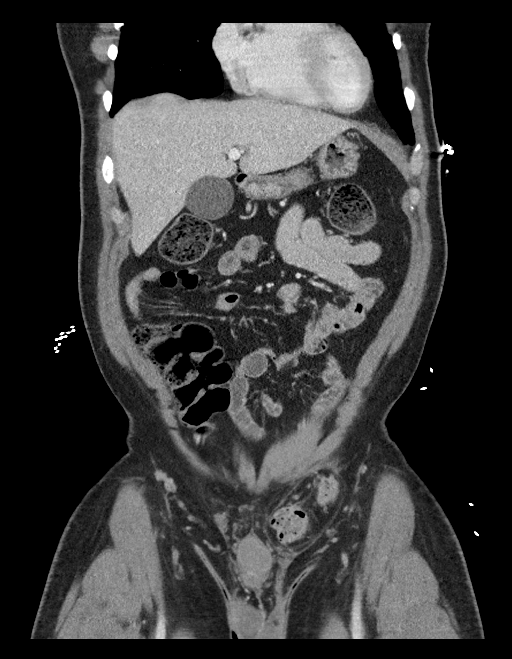
[im 45/101  soft-tissue]
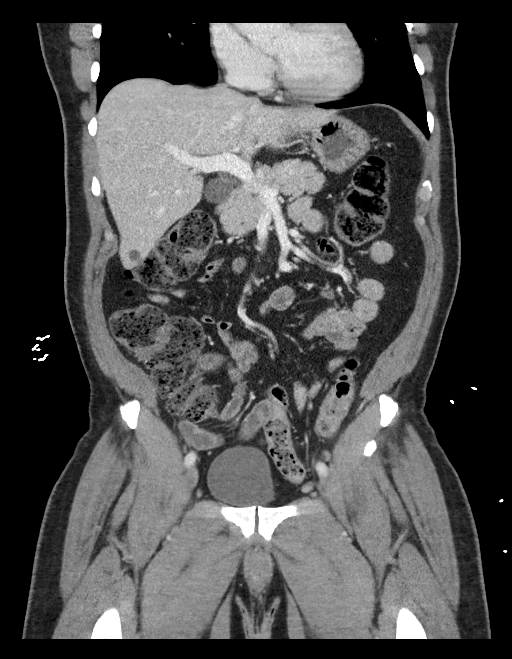
[im 56/101  soft-tissue]
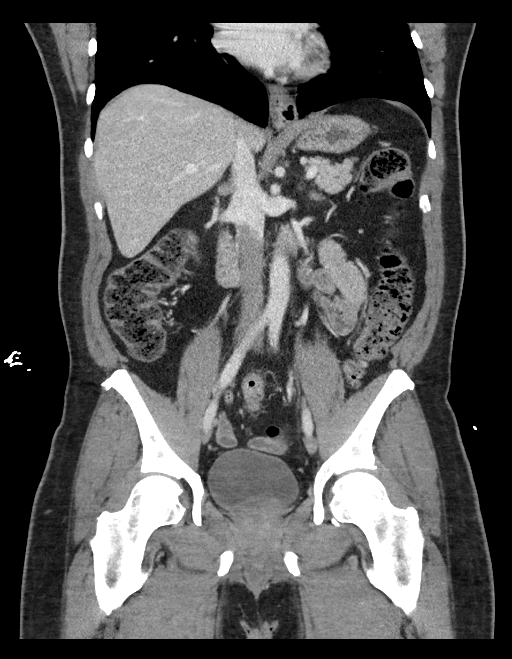

[16 of 46 positions shown; findings below may reference images not displayed]

FINDINGS: Lower chest: No acute abnormality.

Hepatobiliary: Small cyst of the left hepatic lobe. Additional too
small to characterize low-attenuation lesions gallbladder is
unremarkable. No biliary dilatation.

Pancreas: Unremarkable. No pancreatic ductal dilatation or
surrounding inflammatory changes.

Spleen: Normal in size without focal abnormality.

Adrenals/Urinary Tract: Adrenals, kidneys, and bladder are
unremarkable.

Stomach/Bowel: Stomach is notable for a small hiatal hernia. Bowel
is normal in caliber. There is distal colonic diverticulosis. A
short segment of sigmoid colon enters left inguinal hernia. Moderate
stool burden. Normal appendix.

Vascular/Lymphatic: No significant vascular abnormality. No enlarged
lymph nodes.

Reproductive: Unremarkable.

Other: No free fluid. Left inguinal hernia containing fat and
sigmoid colon.

Musculoskeletal: No acute osseous abnormality.
IMPRESSION: Left inguinal hernia containing nonobstructed sigmoid colon.

Sigmoid diverticulosis.
